# Patient Record
Sex: Female | Born: 1990 | Race: Asian | Hispanic: No | Marital: Married | Smoking: Never smoker
Health system: Southern US, Community
[De-identification: ages and names within clinical notes are randomized; demographics above are authoritative.]

## PROBLEM LIST (undated history)

## (undated) HISTORY — PX: LEG SURGERY: SHX1003

---

## 2009-09-11 ENCOUNTER — Encounter: Payer: Self-pay | Admitting: Emergency Medicine

## 2009-09-11 ENCOUNTER — Ambulatory Visit: Payer: Self-pay | Admitting: Diagnostic Radiology

## 2009-09-12 ENCOUNTER — Encounter (INDEPENDENT_AMBULATORY_CARE_PROVIDER_SITE_OTHER): Payer: Self-pay | Admitting: Internal Medicine

## 2009-09-12 ENCOUNTER — Ambulatory Visit: Payer: Self-pay | Admitting: Vascular Surgery

## 2009-09-12 ENCOUNTER — Inpatient Hospital Stay (HOSPITAL_COMMUNITY): Admission: EM | Admit: 2009-09-12 | Discharge: 2009-09-20 | Payer: Self-pay | Admitting: Internal Medicine

## 2009-09-14 ENCOUNTER — Ambulatory Visit: Payer: Self-pay | Admitting: Infectious Diseases

## 2010-08-01 LAB — CULTURE, BLOOD (ROUTINE X 2): Culture: NO GROWTH

## 2010-08-01 LAB — CK: Total CK: 54 U/L (ref 7–177)

## 2010-08-01 LAB — URINE CULTURE: Colony Count: >=100000

## 2010-08-01 LAB — COMPREHENSIVE METABOLIC PANEL
ALT: 13 U/L (ref 0–35)
Alkaline Phosphatase: 57 U/L (ref 39–117)
BUN: 14 mg/dL (ref 6–23)
BUN: 28 mg/dL — ABNORMAL HIGH (ref 6–23)
Calcium: 7.5 mg/dL — ABNORMAL LOW (ref 8.4–10.5)
Chloride: 108 mEq/L (ref 96–112)
Creatinine, Ser: 0.69 mg/dL (ref 0.4–1.2)
Creatinine, Ser: 1.3 mg/dL — ABNORMAL HIGH (ref 0.4–1.2)
GFR calc Af Amer: >60 mL/min (ref >60)
GFR calc non Af Amer: 53 mL/min — ABNORMAL LOW (ref >60)
Glucose, Bld: 119 mg/dL — ABNORMAL HIGH (ref 70–99)
Glucose, Bld: 138 mg/dL — ABNORMAL HIGH (ref 70–99)
Potassium: 3.3 mEq/L — ABNORMAL LOW (ref 3.5–5.1)
Sodium: 136 mEq/L (ref 135–145)
Sodium: 138 mEq/L (ref 135–145)
Total Bilirubin: 0.9 mg/dL (ref 0.3–1.2)
Total Bilirubin: 1.2 mg/dL (ref 0.3–1.2)
Total Protein: 5.6 g/dL — ABNORMAL LOW (ref 6.0–8.3)
Total Protein: 7.5 g/dL (ref 6.0–8.3)

## 2010-08-01 LAB — DIFFERENTIAL
Basophils Absolute: 0 10*3/uL (ref 0.0–0.1)
Basophils Absolute: 0 10*3/uL (ref 0.0–0.1)
Basophils Absolute: 0.1 10*3/uL (ref 0.0–0.1)
Basophils Relative: 0 % (ref 0–1)
Basophils Relative: 0 % (ref 0–1)
Eosinophils Absolute: 0 10*3/uL (ref 0.0–0.7)
Eosinophils Absolute: 0.2 10*3/uL (ref 0.0–0.7)
Eosinophils Absolute: 0.3 10*3/uL (ref 0.0–0.7)
Eosinophils Relative: 0 % (ref 0–5)
Eosinophils Relative: 2 % (ref 0–5)
Lymphs Abs: 0.3 10*3/uL — ABNORMAL LOW (ref 0.7–4.0)
Lymphs Abs: 0.5 10*3/uL — ABNORMAL LOW (ref 0.7–4.0)
Lymphs Abs: 1 10*3/uL (ref 0.7–4.0)
Lymphs Abs: 1.4 10*3/uL (ref 0.7–4.0)
Monocytes Absolute: 0.4 10*3/uL (ref 0.1–1.0)
Monocytes Absolute: 0.6 10*3/uL (ref 0.1–1.0)
Monocytes Absolute: 0.8 10*3/uL (ref 0.1–1.0)
Monocytes Relative: 14 % — ABNORMAL HIGH (ref 3–12)
Monocytes Relative: 3 % (ref 3–12)
Neutro Abs: 12.5 10*3/uL — ABNORMAL HIGH (ref 1.7–7.7)
Neutro Abs: 13.7 10*3/uL — ABNORMAL HIGH (ref 1.7–7.7)
Neutrophils Relative %: 58 % (ref 43–77)
Neutrophils Relative %: 85 % — ABNORMAL HIGH (ref 43–77)
WBC Morphology: INCREASED

## 2010-08-01 LAB — URINE MICROSCOPIC-ADD ON

## 2010-08-01 LAB — CBC
HCT: 28.7 % — ABNORMAL LOW (ref 36.0–46.0)
HCT: 29.8 % — ABNORMAL LOW (ref 36.0–46.0)
HCT: 30.5 % — ABNORMAL LOW (ref 36.0–46.0)
Hemoglobin: 10.5 g/dL — ABNORMAL LOW (ref 12.0–15.0)
Hemoglobin: 12.1 g/dL (ref 12.0–15.0)
Hemoglobin: 8.8 g/dL — ABNORMAL LOW (ref 12.0–15.0)
Hemoglobin: 9.3 g/dL — ABNORMAL LOW (ref 12.0–15.0)
Hemoglobin: 9.7 g/dL — ABNORMAL LOW (ref 12.0–15.0)
MCHC: 34.2 g/dL (ref 30.0–36.0)
MCHC: 34.4 g/dL (ref 30.0–36.0)
MCHC: 34.5 g/dL (ref 30.0–36.0)
MCV: 85.8 fL (ref 78.0–100.0)
MCV: 86 fL (ref 78.0–100.0)
MCV: 86.1 fL (ref 78.0–100.0)
MCV: 86.8 fL (ref 78.0–100.0)
Platelets: 119 10*3/uL — ABNORMAL LOW (ref 150–400)
Platelets: 161 10*3/uL (ref 150–400)
RBC: 3.01 MIL/uL — ABNORMAL LOW (ref 3.87–5.11)
RBC: 3.15 MIL/uL — ABNORMAL LOW (ref 3.87–5.11)
RBC: 3.56 MIL/uL — ABNORMAL LOW (ref 3.87–5.11)
RBC: 4.16 MIL/uL (ref 3.87–5.11)
RDW: 12.2 % (ref 11.5–15.5)
RDW: 12.4 % (ref 11.5–15.5)
RDW: 12.5 % (ref 11.5–15.5)
RDW: 12.6 % (ref 11.5–15.5)
WBC: 13 10*3/uL — ABNORMAL HIGH (ref 4.0–10.5)
WBC: 14.4 10*3/uL — ABNORMAL HIGH (ref 4.0–10.5)
WBC: 6.1 10*3/uL (ref 4.0–10.5)
WBC: 9.8 10*3/uL (ref 4.0–10.5)

## 2010-08-01 LAB — BASIC METABOLIC PANEL
BUN: 2 mg/dL — ABNORMAL LOW (ref 6–23)
BUN: 5 mg/dL — ABNORMAL LOW (ref 6–23)
BUN: 7 mg/dL (ref 6–23)
CO2: 24 mEq/L (ref 19–32)
CO2: 25 mEq/L (ref 19–32)
CO2: 26 mEq/L (ref 19–32)
Calcium: 7.3 mg/dL — ABNORMAL LOW (ref 8.4–10.5)
Calcium: 7.9 mg/dL — ABNORMAL LOW (ref 8.4–10.5)
Chloride: 106 mEq/L (ref 96–112)
Chloride: 108 mEq/L (ref 96–112)
Creatinine, Ser: 0.5 mg/dL (ref 0.4–1.2)
Creatinine, Ser: 0.58 mg/dL (ref 0.4–1.2)
GFR calc Af Amer: >60 mL/min (ref >60)
GFR calc non Af Amer: >60 mL/min (ref >60)
GFR calc non Af Amer: >60 mL/min (ref >60)
Glucose, Bld: 100 mg/dL — ABNORMAL HIGH (ref 70–99)
Glucose, Bld: 104 mg/dL — ABNORMAL HIGH (ref 70–99)
Glucose, Bld: 98 mg/dL (ref 70–99)
Potassium: 3.5 mEq/L (ref 3.5–5.1)
Potassium: 3.9 mEq/L (ref 3.5–5.1)
Sodium: 133 mEq/L — ABNORMAL LOW (ref 135–145)
Sodium: 135 mEq/L (ref 135–145)
Sodium: 139 mEq/L (ref 135–145)

## 2010-08-01 LAB — GRAM STAIN

## 2010-08-01 LAB — WOUND CULTURE: Culture: NO GROWTH

## 2010-08-01 LAB — URINALYSIS, ROUTINE W REFLEX MICROSCOPIC
Glucose, UA: NEGATIVE mg/dL
Protein, ur: 30 mg/dL — AB
Specific Gravity, Urine: 1.022 (ref 1.005–1.030)

## 2010-08-01 LAB — ANAEROBIC CULTURE

## 2010-08-01 LAB — HIV ANTIBODY (ROUTINE TESTING W REFLEX): HIV: NONREACTIVE

## 2010-08-01 LAB — MRSA CULTURE

## 2010-08-01 LAB — CSF CELL COUNT WITH DIFFERENTIAL
Tube #: 1
WBC, CSF: 1 /mm3 (ref 0–5)

## 2010-08-01 LAB — LACTIC ACID, PLASMA
Lactic Acid, Venous: 0.9 mmol/L (ref 0.5–2.2)
Lactic Acid, Venous: 2.4 mmol/L — ABNORMAL HIGH (ref 0.5–2.2)
Lactic Acid, Venous: 3 mmol/L — ABNORMAL HIGH (ref 0.5–2.2)

## 2010-08-01 LAB — CSF CULTURE W GRAM STAIN

## 2010-08-01 LAB — GLUCOSE, CSF: Glucose, CSF: 72 mg/dL (ref 43–76)

## 2010-08-01 LAB — SYNOVIAL CELL COUNT + DIFF, W/ CRYSTALS
Monocyte-Macrophage-Synovial Fluid: 11 % — ABNORMAL LOW (ref 50–90)
WBC, Synovial: 23965 /mm3 — ABNORMAL HIGH (ref 0–200)

## 2010-08-01 LAB — BODY FLUID CULTURE

## 2010-08-01 LAB — PROTIME-INR: INR: 1.67 — ABNORMAL HIGH (ref 0.00–1.49)

## 2010-08-01 LAB — DIC (DISSEMINATED INTRAVASCULAR COAGULATION)PANEL: Platelets: 115 10*3/uL — ABNORMAL LOW (ref 150–400)

## 2011-05-04 IMAGING — CT CT ANGIO CHEST
2 of 6 series · 19 of 36 positions shown · IV contrast (APPLIED)
Comparison: Chest x-ray 09/13/2009.

CLINICAL DATA: Chest pain and fever.  Rule out pulmonary embolism.

CT ANGIOGRAPHY CHEST WITH CONTRAST
TECHNIQUE: Multidetector CT imaging of the chest was performed
using the standard protocol during bolus administration of
intravenous contrast.  Multiplanar CT image reconstructions
including MIPs were obtained to evaluate the vascular anatomy.
Contrast:  100 ml Pmnipaque-I99 IV.

[Series 8: pulm embolism 1.0 b25f thins · axial · 0.58mm/px · z∈[-206,-2]mm · 18 of 228 slices shown]
[im 12/228  lung]
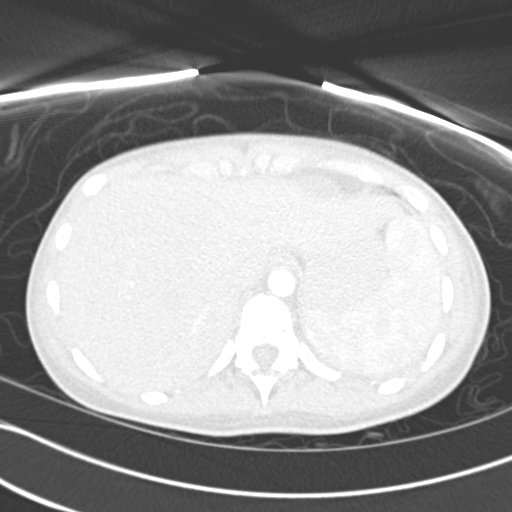
[im 23/228  mediastinal]
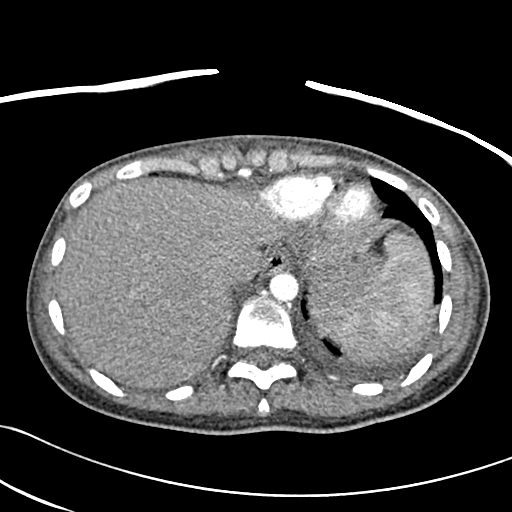
[im 35/228  lung]
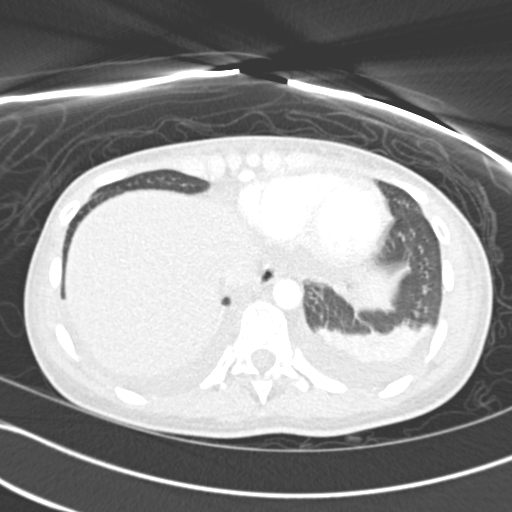
[im 46/228  mediastinal]
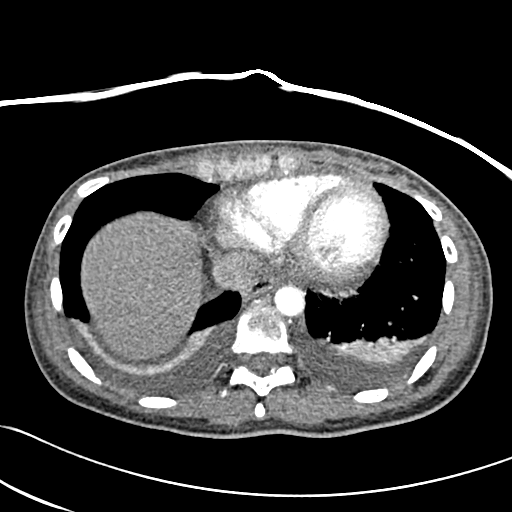
[im 57/228  lung]
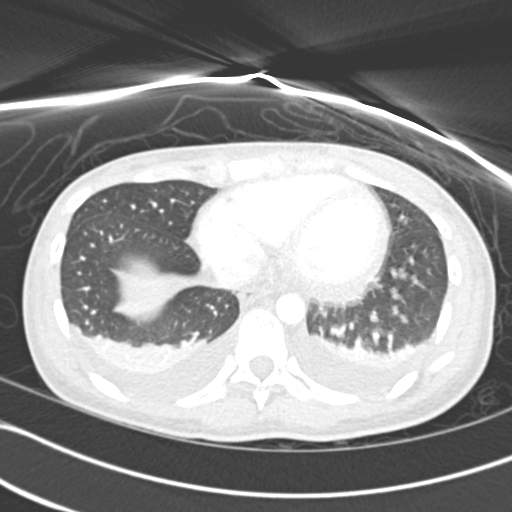
[im 69/228  mediastinal]
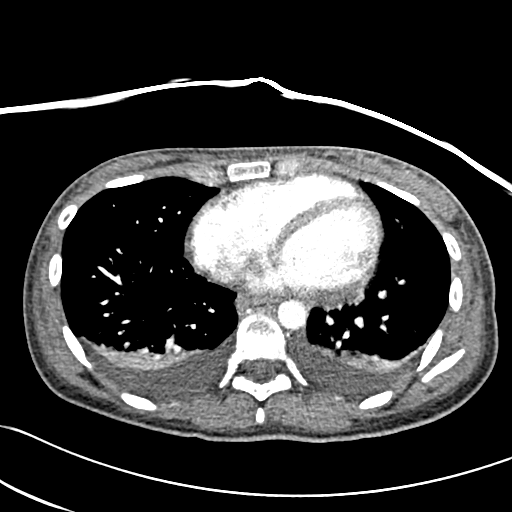
[im 80/228  lung]
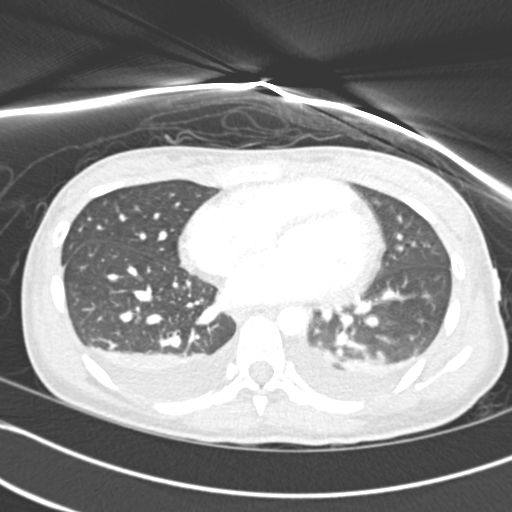
[im 91/228  mediastinal]
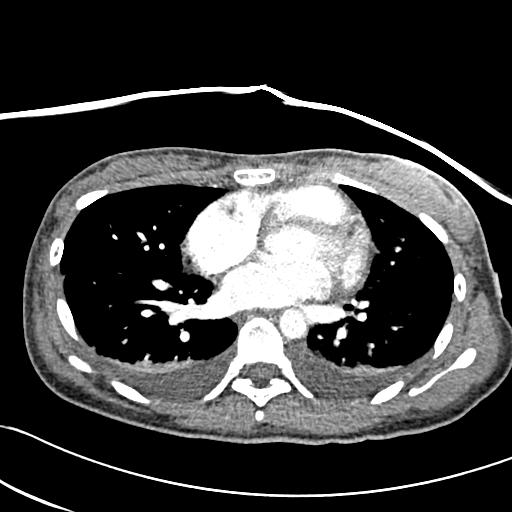
[im 103/228  lung]
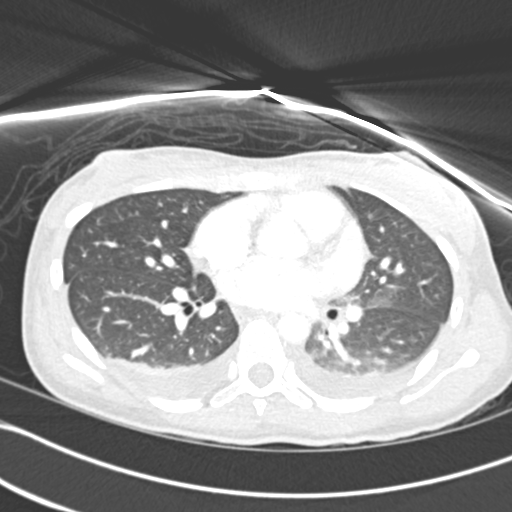
[im 125/228  mediastinal]
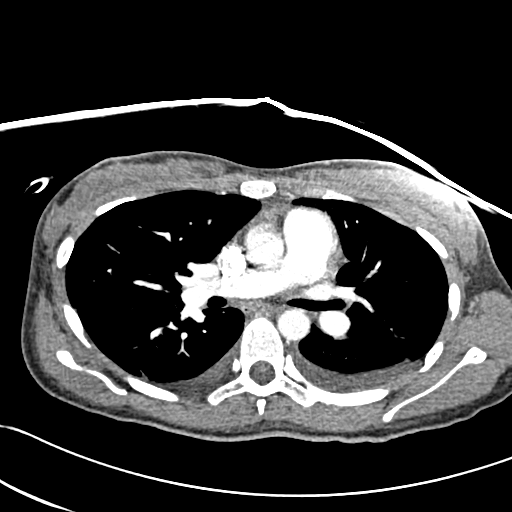
[im 137/228  lung]
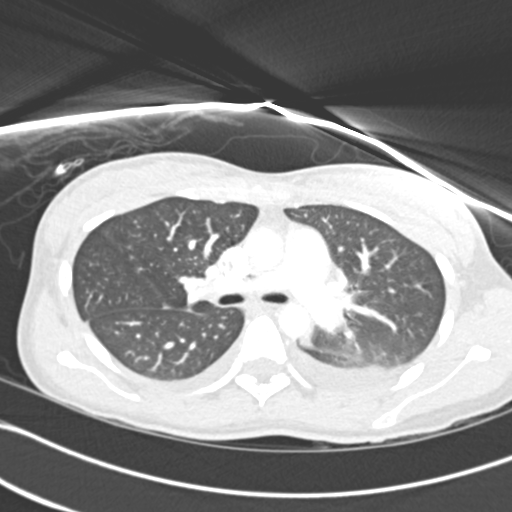
[im 148/228  mediastinal]
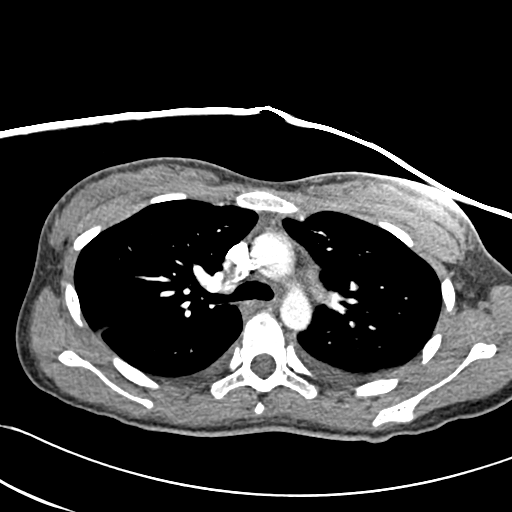
[im 159/228  lung]
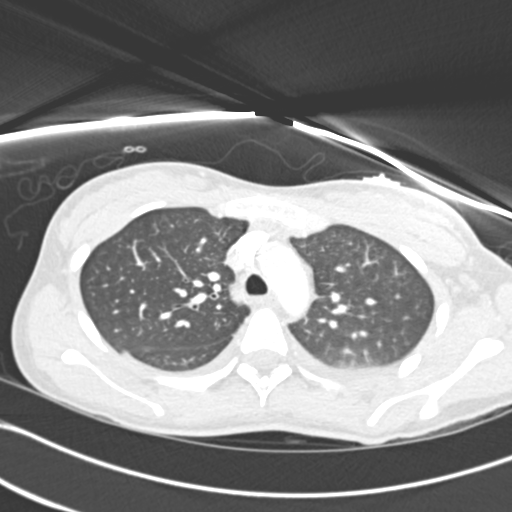
[im 171/228  mediastinal]
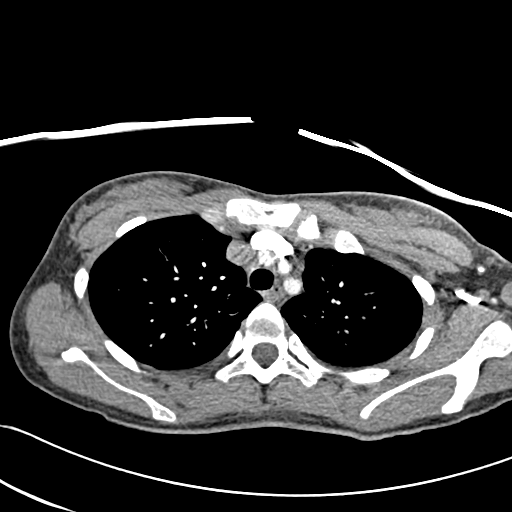
[im 182/228  lung]
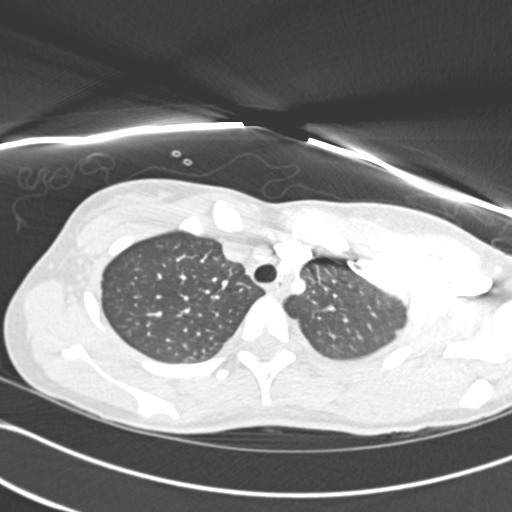
[im 193/228  mediastinal]
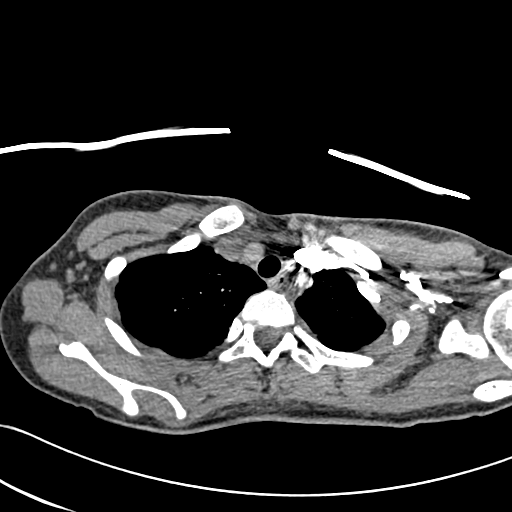
[im 205/228  lung]
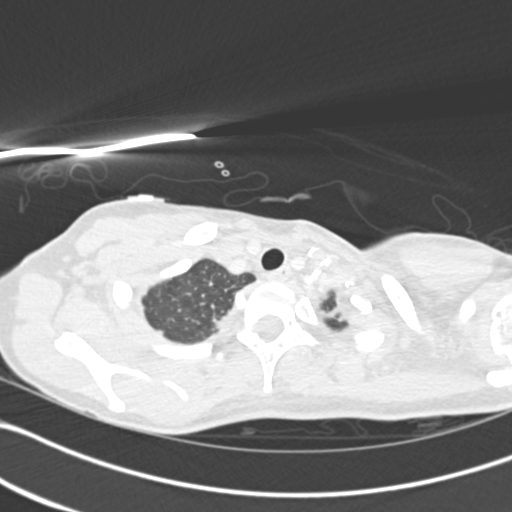
[im 216/228  mediastinal]
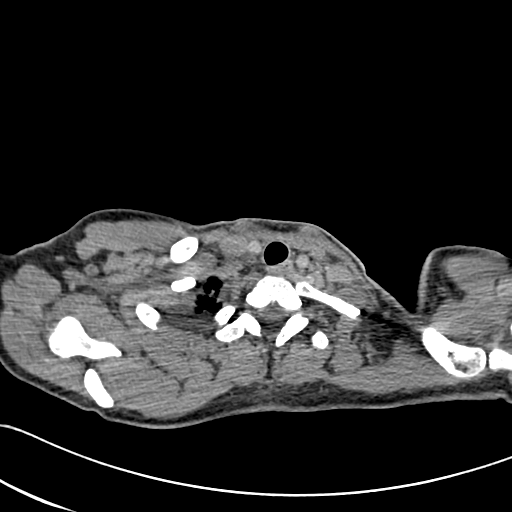

[Series 604: cor · coronal · 0.58mm/px · 1 of 68 slices shown]
[im 34/68  mediastinal]
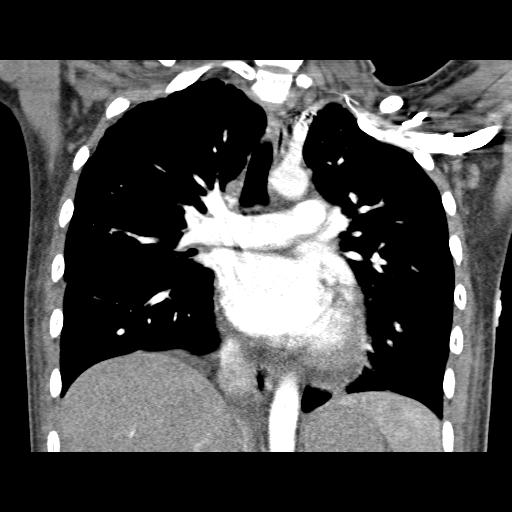

[19 of 36 positions shown; findings below may reference images not displayed]

FINDINGS: Negative for pulmonary embolism.  The aorta is normal.
The heart size is normal and there is no pericardial effusion.

Small bilateral pleural effusions are present.  There is bibasilar
atelectasis.  Left perihilar infiltrate noted on the chest x-ray
appears to be atelectasis, no definite pneumonia.  There is no mass
or adenopathy.

Review of the MIP images confirms the above findings.
IMPRESSION: Negative for pulmonary embolism.

Bilateral pleural effusions and bibasilar atelectasis.

## 2017-09-03 DIAGNOSIS — R109 Unspecified abdominal pain: Secondary | ICD-10-CM | POA: Diagnosis not present

## 2017-11-27 ENCOUNTER — Encounter (HOSPITAL_COMMUNITY): Payer: Self-pay | Admitting: Emergency Medicine

## 2017-11-27 ENCOUNTER — Emergency Department (HOSPITAL_COMMUNITY)
Admission: EM | Admit: 2017-11-27 | Discharge: 2017-11-27 | Disposition: A | Discharge status: Home / Self Care | Payer: Self-pay | Attending: Emergency Medicine | Admitting: Emergency Medicine

## 2017-11-27 DIAGNOSIS — G43009 Migraine without aura, not intractable, without status migrainosus: Secondary | ICD-10-CM | POA: Insufficient documentation

## 2017-11-27 DIAGNOSIS — Z3202 Encounter for pregnancy test, result negative: Secondary | ICD-10-CM | POA: Insufficient documentation

## 2017-11-27 DIAGNOSIS — R519 Headache, unspecified: Secondary | ICD-10-CM

## 2017-11-27 DIAGNOSIS — R51 Headache: Secondary | ICD-10-CM

## 2017-11-27 LAB — CBC WITH DIFFERENTIAL/PLATELET
Basophils Absolute: 0 10*3/uL (ref 0.0–0.1)
Basophils Relative: 0 %
Eosinophils Absolute: 0.5 10*3/uL (ref 0.0–0.7)
Eosinophils Relative: 9 %
HCT: 39.1 % (ref 36.0–46.0)
Hemoglobin: 12.6 g/dL (ref 12.0–15.0)
Lymphocytes Relative: 13 %
Lymphs Abs: 0.7 10*3/uL (ref 0.7–4.0)
MCH: 28.3 pg (ref 26.0–34.0)
MCHC: 32.2 g/dL (ref 30.0–36.0)
MCV: 87.9 fL (ref 78.0–100.0)
Monocytes Absolute: 0.7 10*3/uL (ref 0.1–1.0)
Monocytes Relative: 13 %
Neutro Abs: 3.2 10*3/uL (ref 1.7–7.7)
Neutrophils Relative %: 65 %
Platelets: 218 10*3/uL (ref 150–400)
RBC: 4.45 MIL/uL (ref 3.87–5.11)
RDW: 12.2 % (ref 11.5–15.5)
WBC: 5 10*3/uL (ref 4.0–10.5)

## 2017-11-27 LAB — BASIC METABOLIC PANEL
Anion gap: 7 (ref 5–15)
BUN: 9 mg/dL (ref 6–20)
CO2: 29 mmol/L (ref 22–32)
Calcium: 8.9 mg/dL (ref 8.9–10.3)
Chloride: 105 mmol/L (ref 98–111)
Creatinine, Ser: 0.63 mg/dL (ref 0.44–1.00)
GFR calc Af Amer: >60 mL/min (ref >60)
GFR calc non Af Amer: >60 mL/min (ref >60)
Glucose, Bld: 87 mg/dL (ref 70–99)
Potassium: 3.6 mmol/L (ref 3.5–5.1)
Sodium: 141 mmol/L (ref 135–145)

## 2017-11-27 LAB — URINALYSIS, ROUTINE W REFLEX MICROSCOPIC
Bilirubin Urine: NEGATIVE
Glucose, UA: NEGATIVE mg/dL
Hgb urine dipstick: NEGATIVE
Ketones, ur: NEGATIVE mg/dL
Leukocytes, UA: NEGATIVE
Nitrite: NEGATIVE
Protein, ur: NEGATIVE mg/dL
Specific Gravity, Urine: 1.011 (ref 1.005–1.030)
pH: 6 (ref 5.0–8.0)

## 2017-11-27 LAB — PREGNANCY, URINE: Preg Test, Ur: NEGATIVE

## 2017-11-27 MED ORDER — KETOROLAC TROMETHAMINE 30 MG/ML IJ SOLN
30.0000 mg | Freq: Once | INTRAMUSCULAR | Status: AC
  Administered 2017-11-27: 30 mg via INTRAVENOUS
  Filled 2017-11-27: qty 1

## 2017-11-27 MED ORDER — PROCHLORPERAZINE EDISYLATE 10 MG/2ML IJ SOLN
10.0000 mg | Freq: Once | INTRAMUSCULAR | Status: AC
  Administered 2017-11-27: 10 mg via INTRAVENOUS
  Filled 2017-11-27: qty 2

## 2017-11-27 MED ORDER — SODIUM CHLORIDE 0.9 % IV BOLUS
1000.0000 mL | Freq: Once | INTRAVENOUS | Status: AC
  Administered 2017-11-27: 1000 mL via INTRAVENOUS

## 2017-11-27 MED ORDER — DIPHENHYDRAMINE HCL 50 MG/ML IJ SOLN
25.0000 mg | Freq: Once | INTRAMUSCULAR | Status: AC
  Administered 2017-11-27: 25 mg via INTRAVENOUS
  Filled 2017-11-27: qty 1

## 2017-11-27 NOTE — Discharge Instructions (Addendum)
Return here as needed.  Follow-up with your primary doctor.  You can continue to use the Fioricet as needed for the headache.  Increase your fluid intake.

## 2017-11-27 NOTE — ED Triage Notes (Signed)
Patient here from home with complaints of headache. Given meds with no relief. Tylenol this morning with no relief.

## 2017-11-27 NOTE — ED Provider Notes (Signed)
[facility]-EMERGENCY DEPT Provider Note   CSN: [id number] Arrival date & time: 11/27/17  1005     History   Chief Complaint Chief Complaint  Patient presents with  . Headache    HPI Judith Cook is a 27 y.o. female.  HPI Patient presents to the emergency department with headache over the last 3 days.  The patient states that she was having some chills and some body aches when it initially started.  The patient states she did take some Tylenol without significant relief of her symptoms.  Patient was seen at an urgent care yesterday and provided with a Fioricet prescription.  And Flexeril.  She states that they did not seem to help with her headache.  Patient was given no other prescriptions.  Patient states nothing seems to make the condition better but she does have nausea with some light sensitivity.  Patient states she had similar headaches in the past.  The patient denies chest pain, shortness of breath,blurred vision, neck pain, fever, cough, weakness, numbness, dizziness, anorexia, edema, abdominal pain,  vomiting, diarrhea, rash, back pain, dysuria, hematemesis, bloody stool, near syncope, or syncope. History reviewed. No pertinent past medical history.  There are no active problems to display for this patient.   History reviewed. No pertinent surgical history.   OB History   None      Home Medications    Prior to Admission medications   Medication Sig Start Date End Date Taking? Authorizing Provider  ACETAMINOPHEN-BUTALBITAL 50-325 MG TABS Take 1-2 tablets by mouth every 6 (six) hours as needed (headache).   Yes [provider]  BIOTIN PO Take 1 capsule by mouth daily.   Yes [provider]  cyclobenzaprine (FLEXERIL) 5 MG tablet Take 5-10 mg by mouth 3 (three) times daily as needed for muscle spasms.   Yes [provider]  nitrofurantoin, macrocrystal-monohydrate, (MACROBID) 100 MG capsule Take 100 mg by mouth 2  (two) times daily. For 5 days 11/20/17  Yes [provider]    Family History No family history on file.  Social History Social History   Tobacco Use  . Smoking status: Never Smoker  . Smokeless tobacco: Never Used  Substance Use Topics  . Alcohol use: Never    Frequency: Never  . Drug use: Never     Allergies   Patient has no known allergies.   Review of Systems Review of Systems All other systems negative except as documented in the HPI. All pertinent positives and negatives as reviewed in the HPI.  Physical Exam Updated Vital Signs BP 96/70   Pulse 80   Temp 98.5 F (36.9 C) (Oral)   Resp 20   SpO2 99%   Physical Exam  Constitutional: She is oriented to person, place, and time. She appears well-developed and well-nourished. No distress.  HENT:  Head: Normocephalic and atraumatic.  Mouth/Throat: Oropharynx is clear and moist.  Eyes: Pupils are equal, round, and reactive to light.  Neck: Normal range of motion. Neck supple.  Cardiovascular: Normal rate, regular rhythm and normal heart sounds. Exam reveals no gallop and no friction rub.  No murmur heard. Pulmonary/Chest: Effort normal and breath sounds normal. No respiratory distress. She has no wheezes.  Abdominal: Soft. Bowel sounds are normal. She exhibits no distension. There is no tenderness.  Neurological: She is alert and oriented to person, place, and time. She has normal strength. She exhibits normal muscle tone. Coordination and gait normal. GCS eye subscore is 4. GCS verbal subscore  is 5. GCS motor subscore is 6.  Skin: Skin is warm and dry. Capillary refill takes less than 2 seconds. No rash noted. No erythema.  Psychiatric: She has a normal mood and affect. Her behavior is normal.  Nursing note and vitals reviewed.    ED Treatments / Results  Labs (all labs ordered are listed, but only abnormal results are displayed) Labs Reviewed  BASIC METABOLIC PANEL  CBC WITH DIFFERENTIAL/PLATELET    URINALYSIS, ROUTINE W REFLEX MICROSCOPIC  PREGNANCY, URINE    EKG None  Radiology No results found.  Procedures Procedures (including critical care time)  Medications Ordered in ED Medications  sodium chloride 0.9 % bolus 1,000 mL (1,000 mLs Intravenous New Bag/Given 11/27/17 1421)  sodium chloride 0.9 % bolus 1,000 mL (0 mLs Intravenous Stopped 11/27/17 1421)  ketorolac (TORADOL) 30 MG/ML injection 30 mg (30 mg Intravenous Given 11/27/17 1236)  diphenhydrAMINE (BENADRYL) injection 25 mg (25 mg Intravenous Given 11/27/17 1236)  prochlorperazine (COMPAZINE) injection 10 mg (10 mg Intravenous Given 11/27/17 1236)     Initial Impression / Assessment and Plan / ED Course  I have reviewed the triage vital signs and the nursing notes.  Pertinent labs & imaging results that were available during my care of the patient were reviewed by me and considered in my medical decision making (see chart for details).  The patient has what I feel like is a migraine type headache.  The patient is feeling better following IV fluids and medications.  Patient's neurological exam is intact.  She does not have any signs of sepsis or overt infection at this time.  Patient is advised to return here for any worsening in her condition.  Patient agrees with plan and all questions were answered.  Final Clinical Impressions(s) / ED Diagnoses   Final diagnoses:  None    ED Discharge Orders    None       Charlestine Night, PA-C 11/27/17 1457    Gerhard Munch, MD 11/27/17 1625

## 2019-01-15 ENCOUNTER — Other Ambulatory Visit: Payer: Self-pay

## 2019-01-15 DIAGNOSIS — Z20822 Contact with and (suspected) exposure to covid-19: Secondary | ICD-10-CM

## 2019-01-16 LAB — NOVEL CORONAVIRUS, NAA: SARS-CoV-2, NAA: NOT DETECTED

## 2019-01-30 ENCOUNTER — Other Ambulatory Visit: Payer: Self-pay

## 2019-01-30 DIAGNOSIS — Z20822 Contact with and (suspected) exposure to covid-19: Secondary | ICD-10-CM

## 2019-02-01 LAB — NOVEL CORONAVIRUS, NAA: SARS-CoV-2, NAA: NOT DETECTED

## 2019-04-28 DIAGNOSIS — Z20828 Contact with and (suspected) exposure to other viral communicable diseases: Secondary | ICD-10-CM | POA: Diagnosis not present

## 2019-04-28 DIAGNOSIS — J029 Acute pharyngitis, unspecified: Secondary | ICD-10-CM | POA: Diagnosis not present

## 2019-05-06 DIAGNOSIS — Z03818 Encounter for observation for suspected exposure to other biological agents ruled out: Secondary | ICD-10-CM | POA: Diagnosis not present

## 2020-02-22 DIAGNOSIS — Z20822 Contact with and (suspected) exposure to covid-19: Secondary | ICD-10-CM | POA: Diagnosis not present

## 2020-02-23 DIAGNOSIS — Z03818 Encounter for observation for suspected exposure to other biological agents ruled out: Secondary | ICD-10-CM | POA: Diagnosis not present

## 2023-05-15 NOTE — L&D Delivery Note (Signed)
 Delivery Note MARCHE HOTTENSTEIN is a 33 y.o. G1 now P1 who had a spontaneous delivery at [redacted]w[redacted]d on 05/11/24 at 23:38.  viable female was delivered   (Presentation: LOA ).  APGAR: 8, 8; weight - 2980g (6# 9 oz)  Induction of labor for post-dates. Induced with cytotec , pitocin  and AROM. Progressed normally . Received epidural for pain management. Pushed for 47 minutes. Baby was delivered without difficulty . Nuchal cord x 1 reduced prior to delivery.  Baby had good tone and cry and was placed on the maternal abdomen where routine stimulation and bulb suction was performed. Delayed cord clamping for 60 seconds. I clamped the cord and FOB cut it.  Delivery of placenta was spontaneous. Cord blood was collected. Placenta was found to be intact, 3 -vessel cord was noted. The fundus was found to be firm. 2nd degree perineal laceration was repaired in the normal sterile fashion with 2-0 vicryl. Right sulcal repaired in running locking fashion. Estimated blood loss 400 cc. Cord gases were not sent. Instrument and gauze counts were correct at the end of the procedure. Fundus was firm, bleeding was minimal,  and both mother and baby were doing well when I left the room.   Placenta status: to L&D Cord:  segment and blood collected.  Anesthesia:  epidural  Episiotomy:  none Lacerations:  2nd degree, right sulcal, left peri-clitoral - hemostatic without repair  Suture Repair: 2.0 vicryl Est. Blood Loss (mL):  400 cc  Mom to postpartum.  Baby to Couplet care / Skin to Skin.  Charmaine HERO Sequita Wise 05/12/2024, 12:22 AM

## 2023-10-03 LAB — OB RESULTS CONSOLE GC/CHLAMYDIA
Chlamydia: NEGATIVE
Neisseria Gonorrhea: NEGATIVE

## 2023-10-08 LAB — OB RESULTS CONSOLE RUBELLA ANTIBODY, IGM: Rubella: IMMUNE

## 2023-10-08 LAB — OB RESULTS CONSOLE HEPATITIS B SURFACE ANTIGEN: Hepatitis B Surface Ag: NEGATIVE

## 2023-10-08 LAB — OB RESULTS CONSOLE RPR: RPR: NONREACTIVE

## 2023-10-08 LAB — HEPATITIS C ANTIBODY: HCV Ab: NEGATIVE

## 2023-10-08 LAB — OB RESULTS CONSOLE HIV ANTIBODY (ROUTINE TESTING): HIV: NONREACTIVE

## 2023-10-08 LAB — OB RESULTS CONSOLE ANTIBODY SCREEN: Antibody Screen: NEGATIVE

## 2024-04-08 LAB — OB RESULTS CONSOLE GBS: GBS: NEGATIVE

## 2024-04-27 ENCOUNTER — Other Ambulatory Visit: Payer: Self-pay | Admitting: Obstetrics and Gynecology

## 2024-04-27 DIAGNOSIS — O480 Post-term pregnancy: Secondary | ICD-10-CM

## 2024-04-28 ENCOUNTER — Telehealth (HOSPITAL_COMMUNITY): Payer: Self-pay | Admitting: *Deleted

## 2024-04-28 ENCOUNTER — Encounter (HOSPITAL_COMMUNITY): Payer: Self-pay | Admitting: *Deleted

## 2024-04-28 NOTE — Telephone Encounter (Signed)
 Preadmission screen

## 2024-04-29 ENCOUNTER — Telehealth (HOSPITAL_COMMUNITY): Payer: Self-pay | Admitting: *Deleted

## 2024-04-29 ENCOUNTER — Encounter (HOSPITAL_COMMUNITY): Payer: Self-pay | Admitting: *Deleted

## 2024-04-29 NOTE — Telephone Encounter (Signed)
 Preadmission screen

## 2024-05-11 ENCOUNTER — Inpatient Hospital Stay (HOSPITAL_COMMUNITY)

## 2024-05-11 ENCOUNTER — Inpatient Hospital Stay (HOSPITAL_COMMUNITY): Admitting: Anesthesiology

## 2024-05-11 ENCOUNTER — Other Ambulatory Visit: Payer: Self-pay

## 2024-05-11 ENCOUNTER — Inpatient Hospital Stay (HOSPITAL_COMMUNITY)
Admission: RE | Admit: 2024-05-11 | Discharge: 2024-05-13 | DRG: 807 | Disposition: A | Discharge status: Home / Self Care

## 2024-05-11 ENCOUNTER — Encounter (HOSPITAL_COMMUNITY): Payer: Self-pay | Admitting: Obstetrics and Gynecology

## 2024-05-11 DIAGNOSIS — O365930 Maternal care for other known or suspected poor fetal growth, third trimester, not applicable or unspecified: Secondary | ICD-10-CM | POA: Diagnosis present

## 2024-05-11 DIAGNOSIS — Z3A41 41 weeks gestation of pregnancy: Secondary | ICD-10-CM | POA: Diagnosis not present

## 2024-05-11 DIAGNOSIS — O480 Post-term pregnancy: Principal | ICD-10-CM | POA: Diagnosis present

## 2024-05-11 DIAGNOSIS — Z8249 Family history of ischemic heart disease and other diseases of the circulatory system: Secondary | ICD-10-CM

## 2024-05-11 DIAGNOSIS — O9902 Anemia complicating childbirth: Secondary | ICD-10-CM | POA: Diagnosis present

## 2024-05-11 LAB — SYPHILIS: RPR W/REFLEX TO RPR TITER AND TREPONEMAL ANTIBODIES, TRADITIONAL SCREENING AND DIAGNOSIS ALGORITHM: RPR Ser Ql: NONREACTIVE

## 2024-05-11 LAB — CBC
HCT: 32.3 % — ABNORMAL LOW (ref 36.0–46.0)
Hemoglobin: 10.8 g/dL — ABNORMAL LOW (ref 12.0–15.0)
MCH: 28.5 pg (ref 26.0–34.0)
MCHC: 33.4 g/dL (ref 30.0–36.0)
MCV: 85.2 fL (ref 80.0–100.0)
Platelets: 239 K/uL (ref 150–400)
RBC: 3.79 MIL/uL — ABNORMAL LOW (ref 3.87–5.11)
RDW: 13.8 % (ref 11.5–15.5)
WBC: 6.7 K/uL (ref 4.0–10.5)
nRBC: 0 % (ref 0.0–0.2)

## 2024-05-11 LAB — TYPE AND SCREEN
ABO/RH(D): B POS
Antibody Screen: NEGATIVE

## 2024-05-11 MED ORDER — OXYTOCIN-SODIUM CHLORIDE 30-0.9 UT/500ML-% IV SOLN: 2.5000 [IU]/h | INTRAVENOUS | Status: DC

## 2024-05-11 MED ORDER — DIPHENHYDRAMINE HCL 50 MG/ML IJ SOLN: 12.5000 mg | INTRAMUSCULAR | Status: DC | PRN

## 2024-05-11 MED ORDER — TERBUTALINE SULFATE 1 MG/ML IJ SOLN: 0.2500 mg | Freq: Once | INTRAMUSCULAR | Status: DC | PRN

## 2024-05-11 MED ORDER — OXYCODONE-ACETAMINOPHEN 5-325 MG PO TABS: 1.0000 | ORAL_TABLET | ORAL | Status: DC | PRN

## 2024-05-11 MED ORDER — LACTATED RINGERS IV SOLN: 500.0000 mL | Freq: Once | INTRAVENOUS | Status: DC

## 2024-05-11 MED ORDER — LIDOCAINE HCL (PF) 1 % IJ SOLN
INTRAMUSCULAR | Status: DC | PRN
  Administered 2024-05-11: 8 mL via EPIDURAL

## 2024-05-11 MED ORDER — LACTATED RINGERS IV SOLN
500.0000 mL | Freq: Once | INTRAVENOUS | Status: AC
  Administered 2024-05-11: 500 mL via INTRAVENOUS

## 2024-05-11 MED ORDER — SOD CITRATE-CITRIC ACID 500-334 MG/5ML PO SOLN: 30.0000 mL | ORAL | Status: DC | PRN

## 2024-05-11 MED ORDER — ACETAMINOPHEN 325 MG PO TABS
650.0000 mg | ORAL_TABLET | ORAL | Status: DC | PRN
  Administered 2024-05-11: 650 mg via ORAL
  Filled 2024-05-11: qty 2

## 2024-05-11 MED ORDER — OXYTOCIN BOLUS FROM INFUSION
333.0000 mL | Freq: Once | INTRAVENOUS | Status: AC
  Administered 2024-05-11: 333 mL via INTRAVENOUS

## 2024-05-11 MED ORDER — OXYCODONE-ACETAMINOPHEN 5-325 MG PO TABS: 2.0000 | ORAL_TABLET | ORAL | Status: DC | PRN

## 2024-05-11 MED ORDER — PHENYLEPHRINE 80 MCG/ML (10ML) SYRINGE FOR IV PUSH (FOR BLOOD PRESSURE SUPPORT): 80.0000 ug | PREFILLED_SYRINGE | INTRAVENOUS | Status: DC | PRN

## 2024-05-11 MED ORDER — OXYTOCIN-SODIUM CHLORIDE 30-0.9 UT/500ML-% IV SOLN
1.0000 m[IU]/min | INTRAVENOUS | Status: DC
  Administered 2024-05-11: 2 m[IU]/min via INTRAVENOUS
  Filled 2024-05-11: qty 500

## 2024-05-11 MED ORDER — FENTANYL CITRATE (PF) 100 MCG/2ML IJ SOLN: 50.0000 ug | INTRAMUSCULAR | Status: DC | PRN

## 2024-05-11 MED ORDER — FENTANYL-BUPIVACAINE-NACL 0.5-0.125-0.9 MG/250ML-% EP SOLN: 12.0000 mL/h | EPIDURAL | Status: DC | PRN

## 2024-05-11 MED ORDER — LACTATED RINGERS IV SOLN
500.0000 mL | INTRAVENOUS | Status: DC | PRN
  Administered 2024-05-11 – 2024-05-12 (×2): 500 mL via INTRAVENOUS

## 2024-05-11 MED ORDER — MISOPROSTOL 25 MCG QUARTER TABLET
25.0000 ug | ORAL_TABLET | ORAL | Status: DC | PRN
  Administered 2024-05-11 (×2): 25 ug via VAGINAL
  Filled 2024-05-11 (×2): qty 1

## 2024-05-11 MED ORDER — LACTATED RINGERS IV SOLN: INTRAVENOUS | Status: DC

## 2024-05-11 MED ORDER — LIDOCAINE HCL (PF) 1 % IJ SOLN: 30.0000 mL | INTRAMUSCULAR | Status: DC | PRN

## 2024-05-11 MED ORDER — FENTANYL-BUPIVACAINE-NACL 0.5-0.125-0.9 MG/250ML-% EP SOLN
12.0000 mL/h | EPIDURAL | Status: DC | PRN
  Administered 2024-05-11: 12 mL/h via EPIDURAL
  Filled 2024-05-11: qty 250

## 2024-05-11 MED ORDER — ONDANSETRON HCL 4 MG/2ML IJ SOLN: 4.0000 mg | Freq: Four times a day (QID) | INTRAMUSCULAR | Status: DC | PRN

## 2024-05-11 MED ORDER — EPHEDRINE 5 MG/ML INJ: 10.0000 mg | INTRAVENOUS | Status: DC | PRN

## 2024-05-11 NOTE — Progress Notes (Signed)
 Judith Cook is a 33 y.o. G1P0 at [redacted]w[redacted]d admitted for IOL for post-date  Subjective: Increased pain with contractions, is going to try NO. Denies LOF or VB. + FM  Objective: BP 128/76   Pulse 83   Temp 98.1 F (36.7 C) (Oral)   Resp 18   Ht 5' 5 (1.651 m)   Wt 83.1 kg   SpO2 100%   BMI 30.50 kg/m  No intake/output data recorded. No intake/output data recorded.  FHT: FHR 125, moderate variability, accelerations +, no deceleration. Toco: q2-4 min Cat 1    SVE:   Dilation: 4 Effacement (%): 60 Station: -2 Exam by:: Shahan Starks  Labs: Lab Results  Component Value Date   WBC 6.7 05/11/2024   HGB 10.8 (L) 05/11/2024   HCT 32.3 (L) 05/11/2024   MCV 85.2 05/11/2024   PLT 239 05/11/2024    Assessment / Plan: 33 yo G1P0 at [redacted]w[redacted]d admitted for IOL for post-dates   Labor: s/p cytotec  x 2, pitocin  ordered. Declined AROM w/ this exam, amenable at next. Recheck in 4hr or sooner PRN Fetal Wellbeing:  Category I Pain Control:  IV pain meds until active labor, epidural PRN    Charmaine CHRISTELLA Oz, MD 05/11/2024, 4:39 PM

## 2024-05-11 NOTE — Progress Notes (Signed)
 Judith Cook is a 33 y.o. G1P0 at [redacted]w[redacted]d admitted for IOL for post-date  Subjective: Comfortable w/ epidural. Continued LO. Denies VB. + FM  Objective: BP 112/74   Pulse 70   Temp 97.9 F (36.6 C) (Axillary)   Resp 18   Ht 5' 5 (1.651 m)   Wt 83.1 kg   SpO2 98%   BMI 30.50 kg/m  No intake/output data recorded. Total I/O In: -  Out: 500 [Urine:500]  FHT: FHR 130, moderate variability, accelerations +, intermittent variable deceleration. Toco: q2 min Cat 1    SVE:   Dilation: Lip/rim Effacement (%): 90 Station: 0 Exam by:: Newell Samuel, RN/Erica Karry, RN  Labs: Lab Results  Component Value Date   WBC 6.7 05/11/2024   HGB 10.8 (L) 05/11/2024   HCT 32.3 (L) 05/11/2024   MCV 85.2 05/11/2024   PLT 239 05/11/2024    Assessment / Plan: 33 yo G1P0 at [redacted]w[redacted]d admitted for IOL for post-dates   Labor: s/p cytotec  x 2, pitocin  since 18:40, AROM @ 19:40. Progressing well. Recheck in 30 minutes.  Fetal Wellbeing:  Category I Pain Control:  epidural in place     Charmaine CHRISTELLA Oz, MD 05/11/2024, 10:07 PM

## 2024-05-11 NOTE — Anesthesia Procedure Notes (Signed)
 Epidural Patient location during procedure: OB Start time: 05/11/2024 5:16 PM End time: 05/11/2024 5:20 PM  Staffing Anesthesiologist: Mallory Manus, MD  Preanesthetic Checklist Completed: patient identified, IV checked, site marked, risks and benefits discussed, surgical consent, monitors and equipment checked, pre-op evaluation and timeout performed  Epidural Patient position: sitting Prep: DuraPrep and site prepped and draped Patient monitoring: continuous pulse ox and blood pressure Approach: midline Location: L4-L5 Injection technique: LOR air  Needle:  Needle type: Tuohy  Needle gauge: 17 G Needle length: 9 cm and 9 Needle insertion depth: 6 cm Catheter type: closed end flexible Catheter size: 19 Gauge Catheter at skin depth: 12 cm Test dose: negative  Assessment Events: blood not aspirated, no cerebrospinal fluid, injection not painful, no injection resistance, no paresthesia and negative IV test

## 2024-05-11 NOTE — H&P (Signed)
 Judith Cook is a 33 y.o. female G1P0 at [redacted]w[redacted]d presenting for IOL for post-dates.  Pregnancy complicated by: FGR, now resolved - By [facility] < 10% at 24 weeks (overal EFW 20%), started weekly UAD/BPP at 28 weeks &serial growth US . Declines referral to MFM. RESOLVED at 32 Last growth at 36w - EFW 2607g/5lb12oz (22%), AC 17.2%. AFI 12.4 Anemia of pregnancy - on PO iron, admit Hgb 10.8  Patient is doing well. Notes mild pain with contraction. Denies LOF or VB, + FM   OB History     Gravida  1   Para      Term      Preterm      AB      Living         SAB      IAB      Ectopic      Multiple      Live Births             No past medical history on file. Past Surgical History:  Procedure Laterality Date   LEG SURGERY Right    Family History: family history includes Hypertension in her father. Social History:  reports that she has never smoked. She has never used smokeless tobacco. She reports that she does not drink alcohol and does not use drugs.     Maternal Diabetes: No Genetic Screening: Normal Maternal Ultrasounds/Referrals: IUGR - resolved Fetal Ultrasounds or other Referrals:  None Maternal Substance Abuse:  No Significant Maternal Medications:  None Significant Maternal Lab Results:  Group B Strep negative Number of Prenatal Visits:greater than 3 verified prenatal visits Maternal Vaccinations:TDap   Review of Systems  Constitutional:  Negative for chills and fever.  Respiratory:  Negative for cough and shortness of breath.   Cardiovascular:  Negative for chest pain and palpitations.  Gastrointestinal:  Negative for diarrhea, nausea and vomiting.  Genitourinary:  Negative for dysuria.  Skin:  Negative for rash.  Neurological:  Negative for syncope and headaches.  Psychiatric/Behavioral:  The patient is not nervous/anxious.      Blood pressure 129/82, pulse 94, temperature 98 F (36.7 C), resp. rate 18, height 5' 5 (1.651 m), weight 83.1 kg,  SpO2 99%.  SVE: 1/T/-3, RN @ [id number]  Exam Physical Exam Constitutional:      Appearance: Normal appearance.  HENT:     Head: Normocephalic.     Mouth/Throat:     Mouth: Mucous membranes are moist.  Eyes:     Pupils: Pupils are equal, round, and reactive to light.  Cardiovascular:     Rate and Rhythm: Normal rate.  Pulmonary:     Effort: Pulmonary effort is normal.  Abdominal:     Comments: Gravid, non-tender  Musculoskeletal:        General: Normal range of motion.     Cervical back: Normal range of motion.  Skin:    General: Skin is warm.  Neurological:     General: No focal deficit present.     Mental Status: She is alert and oriented to person, place, and time.  Psychiatric:        Mood and Affect: Mood normal.        Behavior: Behavior normal.     Prenatal labs: ABO, Rh:  + Antibody: Negative (05/27 0000) Rubella: Immune (05/27 0000) RPR: Nonreactive (05/27 0000)  HBsAg: Negative (05/27 0000)  HIV: Non-reactive (05/27 0000)  GBS: Negative/-- (11/26 0000)   Assessment/Plan: 33 yo G1P0 at [redacted]w[redacted]d admitted for IOL  for post-dates.   Labor: plan for cooks catheter and cytotec   Pain control: IV pain medication until active labor, epidural PRN GBS negative Baby girl - Judith Cook 05/11/2024, 7:19 AM

## 2024-05-11 NOTE — Anesthesia Preprocedure Evaluation (Signed)
"                                    Anesthesia Evaluation  Patient identified by MRN, date of birth, ID band Patient awake    Reviewed: Allergy & Precautions, H&P , NPO status , Patient's Chart, lab work & pertinent test results, reviewed documented beta blocker date and time   Airway Mallampati: III  TM Distance: >3 FB Neck ROM: full    Dental no notable dental hx. (+) Teeth Intact, Dental Advisory Given   Pulmonary neg pulmonary ROS   Pulmonary exam normal breath sounds clear to auscultation       Cardiovascular negative cardio ROS Normal cardiovascular exam Rhythm:regular Rate:Normal     Neuro/Psych negative neurological ROS  negative psych ROS   GI/Hepatic negative GI ROS, Neg liver ROS,,,  Endo/Other  negative endocrine ROS    Renal/GU negative Renal ROS  negative genitourinary   Musculoskeletal   Abdominal   Peds  Hematology negative hematology ROS (+)   Anesthesia Other Findings   Reproductive/Obstetrics (+) Pregnancy                              Anesthesia Physical Anesthesia Plan  ASA: 2  Anesthesia Plan: Epidural   Post-op Pain Management: Minimal or no pain anticipated   Induction: Intravenous  PONV Risk Score and Plan: 2 and Treatment may vary due to age or medical condition  Airway Management Planned: Natural Airway and Simple Face Mask  Additional Equipment: Fetal Monitoring  Intra-op Plan:   Post-operative Plan:   Informed Consent: I have reviewed the patients History and Physical, chart, labs and discussed the procedure including the risks, benefits and alternatives for the proposed anesthesia with the patient or authorized representative who has indicated his/her understanding and acceptance.     Dental Advisory Given  Plan Discussed with: Anesthesiologist  Anesthesia Plan Comments: (Labs checked- platelets confirmed with RN in room. Fetal heart tracing, per RN, reported to be stable  enough for sitting procedure. Discussed epidural, and patient consents to the procedure:  included risk of possible headache,backache, failed block, allergic reaction, and nerve injury. This patient was asked if she had any questions or concerns before the procedure started.)        Anesthesia Quick Evaluation  "

## 2024-05-11 NOTE — Progress Notes (Signed)
 Judith Cook is a 33 y.o. G1P0 at [redacted]w[redacted]d admitted for IOL for post-date  Subjective: Comfortable w/ epidural. Denies LOF or VB. + FM  Objective: BP 115/76   Pulse 76   Temp 97.9 F (36.6 C) (Axillary)   Resp 18   Ht 5' 5 (1.651 m)   Wt 83.1 kg   SpO2 100%   BMI 30.50 kg/m  No intake/output data recorded. Total I/O In: -  Out: 500 [Urine:500]  FHT: FHR 125, moderate variability, accelerations +, no deceleration. Toco: q4 min Cat 1    SVE:   Dilation: 5 Effacement (%): 80 Station: -2 Exam by:: Yakub Lodes, MD  Labs: Lab Results  Component Value Date   WBC 6.7 05/11/2024   HGB 10.8 (L) 05/11/2024   HCT 32.3 (L) 05/11/2024   MCV 85.2 05/11/2024   PLT 239 05/11/2024    Assessment / Plan: 33 yo G1P0 at 101w1d admitted for IOL for post-dates   Labor: s/p cytotec  x 2, pitocin  since 18:40 - continue to titrate q30 min by 2 milli-unit, AROM @ 19:40 Recheck in 4hr or sooner PRN Fetal Wellbeing:  Category I Pain Control:  epidural in place     Judith CHRISTELLA Oz, MD 05/11/2024, 7:47 PM

## 2024-05-11 NOTE — Progress Notes (Signed)
 Judith Cook is a 33 y.o. G1P0 at [redacted]w[redacted]d admitted for IOL for post-date  Subjective: Mild pain with contraction. Denies LOF or VB. + FM  Objective: BP 124/81   Pulse 86   Temp 98 F (36.7 C)   Resp 18   Ht 5' 5 (1.651 m)   Wt 83.1 kg   SpO2 97%   BMI 30.50 kg/m  No intake/output data recorded. No intake/output data recorded.  FHT: FHR 125, moderate variability, accelerations +, no deceleration. Toco: irregular Cat 1    SVE:   Dilation: 2 Effacement (%): 50 Station: -3 Exam by:: Elio Haden  Labs: Lab Results  Component Value Date   WBC 6.7 05/11/2024   HGB 10.8 (L) 05/11/2024   HCT 32.3 (L) 05/11/2024   MCV 85.2 05/11/2024   PLT 239 05/11/2024    Assessment / Plan: 33 yo G1P0 at [redacted]w[redacted]d admitted for IOL for post-dates   Labor: s/p cytotec  @ 08:25, progressing well. If 2-3 cm at 4hr mark, plan for second vaginal cytotec . Pitocin  and AROM PRN. Fetal Wellbeing:  Category I Pain Control:  IV pain meds until active labor, epidural PRN    Judith CHRISTELLA Oz, MD 05/11/2024, 11:14 AM

## 2024-05-12 ENCOUNTER — Encounter (HOSPITAL_COMMUNITY): Payer: Self-pay | Admitting: Obstetrics and Gynecology

## 2024-05-12 LAB — CBC
HCT: 26.5 % — ABNORMAL LOW (ref 36.0–46.0)
Hemoglobin: 8.5 g/dL — ABNORMAL LOW (ref 12.0–15.0)
MCH: 28.1 pg (ref 26.0–34.0)
MCHC: 32.1 g/dL (ref 30.0–36.0)
MCV: 87.5 fL (ref 80.0–100.0)
Platelets: 186 K/uL (ref 150–400)
RBC: 3.03 MIL/uL — ABNORMAL LOW (ref 3.87–5.11)
RDW: 13.8 % (ref 11.5–15.5)
WBC: 12.8 K/uL — ABNORMAL HIGH (ref 4.0–10.5)
nRBC: 0 % (ref 0.0–0.2)

## 2024-05-12 MED ORDER — IBUPROFEN 600 MG PO TABS
600.0000 mg | ORAL_TABLET | Freq: Four times a day (QID) | ORAL | Status: DC
  Administered 2024-05-12 – 2024-05-13 (×6): 600 mg via ORAL
  Filled 2024-05-12 (×6): qty 1

## 2024-05-12 MED ORDER — ACETAMINOPHEN 325 MG PO TABS: 650.0000 mg | ORAL_TABLET | Freq: Four times a day (QID) | ORAL | Status: DC | PRN

## 2024-05-12 MED ORDER — ONDANSETRON HCL 4 MG/2ML IJ SOLN: 4.0000 mg | INTRAMUSCULAR | Status: DC | PRN

## 2024-05-12 MED ORDER — BENZOCAINE-MENTHOL 20-0.5 % EX AERO: 1.0000 | INHALATION_SPRAY | CUTANEOUS | Status: DC | PRN

## 2024-05-12 MED ORDER — SIMETHICONE 80 MG PO CHEW: 80.0000 mg | CHEWABLE_TABLET | ORAL | Status: DC | PRN

## 2024-05-12 MED ORDER — OXYCODONE HCL 5 MG PO TABS: 5.0000 mg | ORAL_TABLET | ORAL | Status: DC | PRN

## 2024-05-12 MED ORDER — COCONUT OIL OIL: 1.0000 | TOPICAL_OIL | Status: DC | PRN

## 2024-05-12 MED ORDER — DOCUSATE SODIUM 100 MG PO CAPS
100.0000 mg | ORAL_CAPSULE | Freq: Two times a day (BID) | ORAL | Status: DC
  Administered 2024-05-13: 100 mg via ORAL
  Filled 2024-05-12: qty 1

## 2024-05-12 MED ORDER — DIBUCAINE (PERIANAL) 1 % EX OINT: 1.0000 | TOPICAL_OINTMENT | CUTANEOUS | Status: DC | PRN

## 2024-05-12 MED ORDER — WITCH HAZEL-GLYCERIN EX PADS: 1.0000 | MEDICATED_PAD | CUTANEOUS | Status: DC | PRN

## 2024-05-12 MED ORDER — AMMONIA AROMATIC IN INHA
RESPIRATORY_TRACT | Status: AC
  Filled 2024-05-12: qty 10

## 2024-05-12 MED ORDER — TETANUS-DIPHTH-ACELL PERTUSSIS 5-2-15.5 LF-MCG/0.5 IM SUSP: 0.5000 mL | Freq: Once | INTRAMUSCULAR | Status: DC

## 2024-05-12 MED ORDER — PRENATAL MULTIVITAMIN CH
1.0000 | ORAL_TABLET | Freq: Every day | ORAL | Status: DC
  Administered 2024-05-12 – 2024-05-13 (×2): 1 via ORAL
  Filled 2024-05-12 (×2): qty 1

## 2024-05-12 MED ORDER — DIPHENHYDRAMINE HCL 25 MG PO CAPS: 25.0000 mg | ORAL_CAPSULE | Freq: Four times a day (QID) | ORAL | Status: DC | PRN

## 2024-05-12 MED ORDER — ONDANSETRON HCL 4 MG PO TABS: 4.0000 mg | ORAL_TABLET | ORAL | Status: DC | PRN

## 2024-05-12 MED ORDER — OXYCODONE HCL 5 MG PO TABS: 10.0000 mg | ORAL_TABLET | ORAL | Status: DC | PRN

## 2024-05-12 NOTE — Progress Notes (Signed)
 Patient unable to pass urine after 6 hour, RN performed bladder scan which showed 500 ml of urine in bladder. In and out cath done and drained 900 ml of urine. Patient tolerated procedure well. RN encouraged patient to try using the commode frequently.

## 2024-05-12 NOTE — Lactation Note (Signed)
 This note was copied from a baby's chart. Lactation Consultation Note  Patient Name: Judith Cook Unijb'd Date: 05/12/2024 Age:34 hours, P1  Reason for consult: Initial assessment;Primapara;1st time breastfeeding;Term;Breastfeeding assistance Per mom the baby latched early on for 5 mins, LC noted on the doc flow sheets Latch score 8 . Since has had bottles due to per mom wasn't sure she was giving the baby wasn't getting enough to eat. Per mom the baby is due to feed. LC offered to assist and mom receptive. LC changed a large wet and placed the baby STS on the right breast, football and was on/ off at 1st and then 2nd latched the baby was able to get more depth and fed 20 mins sustained the depth.  LC recommended and provided a hand pump to pre- pump prior to each latch to stretch the nipple / areola complex and prime the milk ducts.  LC recommended since the baby has been introduced to formula , baby have to top her off.  LC reviewed the breast feeding goals for 24 hours - feed with cues and by 3 hours.   Maternal Data -  Has patient been taught Hand Expression?: Yes Does the patient have breastfeeding experience prior to this delivery?: No  Feeding Mother's Current Feeding Choice: Breast Milk and Formula  LATCH Score Latch: Grasps breast easily, tongue down, lips flanged, rhythmical sucking.  Audible Swallowing: Spontaneous and intermittent  Type of Nipple: Everted at rest and after stimulation  Comfort (Breast/Nipple): Soft / non-tender  Hold (Positioning): Assistance needed to correctly position infant at breast and maintain latch.  LATCH Score: 9   Lactation Tools Discussed/Used  Hand pump , #18 F   Interventions Interventions: Breast feeding basics reviewed;Assisted with latch;Skin to skin;Breast massage;Hand express;Pre-pump if needed;Reverse pressure;Breast compression;Adjust position;Support pillows;Position options;Shells;Hand pump;Education;LC Services  brochure;CDC milk storage guidelines;CDC Guidelines for Breast Pump Cleaning  Discharge Pump: Manual;Hands Free;Personal  Consult Status Consult Status: Follow-up Date: 05/13/24 Follow-up type: In-patient    Rollene Caldron Tannah Dreyfuss 05/12/2024, 6:02 PM

## 2024-05-12 NOTE — Anesthesia Postprocedure Evaluation (Signed)
"   Anesthesia Post Note  Patient: Judith Cook  Procedure(s) Performed: AN AD HOC LABOR EPIDURAL     Patient location during evaluation: Mother Baby Anesthesia Type: Epidural Level of consciousness: awake and alert Pain management: pain level controlled Vital Signs Assessment: post-procedure vital signs reviewed and stable Respiratory status: spontaneous breathing, nonlabored ventilation and respiratory function stable Cardiovascular status: stable Postop Assessment: no headache, no backache and epidural receding Anesthetic complications: no   No notable events documented.  Last Vitals:  Vitals:   05/12/24 0337 05/12/24 0800  BP: 119/73 114/75  Pulse: 98 (!) 107  Resp: 18 18  Temp: 36.9 C 36.7 C  SpO2: 98% 99%    Last Pain:  Vitals:   05/12/24 0800  TempSrc: Oral  PainSc: 0-No pain   Pain Goal:                   Deuntae Kocsis      "

## 2024-05-12 NOTE — Progress Notes (Signed)
 Post Partum Day 1 Subjective: Pain controlled. No void, cath done @ 0730. Just attempted to avoid again, no urge. Ambulating. No dizziness  Objective: Blood pressure 114/75, pulse (!) 107, temperature 98.1 F (36.7 C), temperature source Oral, resp. rate 18, height 5' 5 (1.651 m), weight 83.1 kg, SpO2 99%, unknown if currently breastfeeding.  Physical Exam:  General: alert, cooperative, and no distress Lochia: appropriate Perineum: healing well so far Uterine Fundus: firm DVT Evaluation: 1+ edema bilaterally, no calf tenderness  Recent Labs    05/11/24 0738 05/12/24 0455  HGB 10.8* 8.5*  HCT 32.3* 26.5*    Assessment/Plan: 33 yo G1 now P1 PPD#1 (delivery 20 minutes prior to MN) following IOL late term  - PP: awaiting void. - ABLA, not clinically significant for this hospitalization: Hb 10.8-->8.5 - Dispo: anticipate DC home tomorrow   LOS: 1 day   Judith DELENA Sharps, DO 05/12/2024, 10:26 AM

## 2024-05-13 MED ORDER — IBUPROFEN 600 MG PO TABS: 600.0000 mg | ORAL_TABLET | Freq: Four times a day (QID) | ORAL | Status: AC | PRN

## 2024-05-13 MED ORDER — ACETAMINOPHEN 325 MG PO TABS: 650.0000 mg | ORAL_TABLET | Freq: Four times a day (QID) | ORAL | Status: AC | PRN

## 2024-05-13 NOTE — Progress Notes (Signed)
 Post Partum Day 2 Subjective: Pain is well controlled. Ambulating, voiding, and tolerating PO well. Passing gas and has had a bowel movement. Denies lightheadedness. Lochia appropriate. Bottlefeeding. Desires discharge home.   Objective:    05/13/2024    1:23 PM 05/13/2024    4:55 AM 05/12/2024    7:31 PM  Vitals with BMI  Systolic 126 120 880  Diastolic 83 79 72  Pulse 108 100 101     Physical Exam:  General: alert, cooperative, and no distress Lochia: appropriate Uterine Fundus: firm DVT Evaluation: no e/o DVT  Recent Labs    05/11/24 0738 05/12/24 0455  HGB 10.8* 8.5*  HCT 32.3* 26.5*    Assessment/Plan: 33 yo G1 now P1 PPD#2 (delivery 20 minutes prior to MN) following IOL late term  - PP: Doing well. Continue routine postpartum care. - ABLA, not clinically significant for this hospitalization: Hb 10.8-->8.5. Asx. Likely cause for mild tachycardia. Plan PO iron outpatient.  - Dispo: anticipate DC home today.   LOS: 2 days   Rubie DELENA Husky, MD 05/13/2024, 3:25 PM

## 2024-05-13 NOTE — Discharge Summary (Signed)
 "    Postpartum Discharge Summary  Date of Service updated 12/29-12/31/2025     Patient Name: Judith Cook DOB: 05-04-1991 MRN: [id number]  Date of admission: 05/11/2024 Delivery date:05/11/2024 Delivering provider: LANE CHARMAINE HERO Date of discharge: 05/13/2024  Admitting diagnosis: Post term pregnancy at [redacted] weeks gestation [O48.0, Z3A.41] Intrauterine pregnancy: [redacted]w[redacted]d     Secondary diagnosis:  Principal Problem:   Post term pregnancy at [redacted] weeks gestation  Additional problems: ABLA    Discharge diagnosis: Term Pregnancy Delivered and Anemia                                              Post partum procedures:none Augmentation: AROM, Pitocin , and Cytotec  Complications: None  Hospital course: Induction of Labor With Vaginal Delivery   33 y.o. yo G1P1001 at [redacted]w[redacted]d was admitted to the hospital 05/11/2024 for induction of labor.  Indication for induction: Postdates.  Patient had an labor course complicated by nothing Membrane Rupture Time/Date: 7:39 PM,05/11/2024  Delivery Method:Vaginal, Spontaneous Operative Delivery:N/A Episiotomy: None Lacerations:  2nd degree;Perineal Details of delivery can be found in separate delivery note.  Patient had a postpartum course complicated by ABLA. Patient is discharged home 05/13/2024.  Newborn Data: Birth date:05/11/2024 Birth time:11:38 PM Gender:Female Living status:Living Apgars:8 ,9  Weight:2980 g  Magnesium Sulfate received: No BMZ received: No Rhophylac:No MMR:No T-DaP:Given prenatally Flu: No RSV Vaccine received: No Transfusion:No Immunizations administered: There is no immunization history for the selected administration types on file for this patient.  Physical exam  Vitals:   05/12/24 1556 05/12/24 1931 05/13/24 0455 05/13/24 1323  BP: 116/75 119/72 120/79 126/83  Pulse: 97 (!) 101 100 (!) 108  Resp: 16 17 16 16   Temp: 98.2 F (36.8 C) 98.5 F (36.9 C) 98.5 F (36.9 C) 98 F (36.7 C)  TempSrc:  Oral Oral Oral Oral  SpO2: 100% 99% 98%   Weight:      Height:       General: alert, cooperative, and no distress Lochia: appropriate Uterine Fundus: firm DVT Evaluation: No evidence of DVT seen on physical exam. Labs: Lab Results  Component Value Date   WBC 12.8 (H) 05/12/2024   HGB 8.5 (L) 05/12/2024   HCT 26.5 (L) 05/12/2024   MCV 87.5 05/12/2024   PLT 186 05/12/2024      Latest Ref Rng & Units 11/27/2017   12:31 PM  CMP  Glucose 70 - 99 mg/dL 87   BUN 6 - 20 mg/dL 9   Creatinine 9.55 - 8.99 mg/dL 9.36   Sodium 864 - 854 mmol/L 141   Potassium 3.5 - 5.1 mmol/L 3.6   Chloride 98 - 111 mmol/L 105   CO2 22 - 32 mmol/L 29   Calcium 8.9 - 10.3 mg/dL 8.9    Edinburgh Score:    05/12/2024    7:32 PM  Edinburgh Postnatal Depression Scale Screening Tool  I have been able to laugh and see the funny side of things. 0  I have looked forward with enjoyment to things. 0  I have blamed myself unnecessarily when things went wrong. 1  I have been anxious or worried for no good reason. 0  I have felt scared or panicky for no good reason. 1  Things have been getting on top of me. 0  I have been so unhappy that I have had difficulty sleeping. 0  I  have felt sad or miserable. 0  I have been so unhappy that I have been crying. 0  The thought of harming myself has occurred to me. 0  Edinburgh Postnatal Depression Scale Total 2      After visit meds:  Allergies as of 05/13/2024   No Known Allergies      Medication List     STOP taking these medications    ACETAMINOPHEN -BUTALBITAL 50-325 MG Tabs   BIOTIN PO   cyclobenzaprine 5 MG tablet Commonly known as: FLEXERIL   nitrofurantoin (macrocrystal-monohydrate) 100 MG capsule Commonly known as: MACROBID       TAKE these medications    acetaminophen  325 MG tablet Commonly known as: Tylenol  Take 2 tablets (650 mg total) by mouth every 6 (six) hours as needed (for pain scale < 4).   ibuprofen  600 MG tablet Commonly  known as: ADVIL  Take 1 tablet (600 mg total) by mouth every 6 (six) hours as needed.         Discharge home in stable condition Infant Feeding: Breast Infant Disposition:home with mother Discharge instruction: per After Visit Summary and Postpartum booklet. Activity: Advance as tolerated. Pelvic rest for 6 weeks.  Diet: routine diet Anticipated Birth Control: Unsure Postpartum Appointment:4 weeks Additional Postpartum F/U: routine Future Appointments:No future appointments. Follow up Visit:  Follow-up Information     Ob/Gyn, [facility] Follow up in 4 week(s).   Contact information: [address] [place] [address] [phone]                     05/13/2024 Rubie DELENA Husky, MD   "

## 2024-05-13 NOTE — Progress Notes (Signed)
 CSW received a consult due to MOB not having custody over her kids. Per chart review, MOB is a G1P1. Per support staff, MOB is a first time mom. At this time there will be no CSW involvement needed. Please contact the CSW if additional needs are required.  CSW identifies no further need for intervention and no barriers to discharge at this time.  Rosina Molt, [place] Clinical Social Worker [phone]

## 2024-05-20 ENCOUNTER — Telehealth (HOSPITAL_COMMUNITY): Payer: Self-pay | Admitting: *Deleted

## 2024-05-20 NOTE — Telephone Encounter (Signed)
 05/20/2024  Name: TESSA SEABERRY MRN: [id number] DOB: [birth year]  Reason for Call:  Transition of Care Hospital Discharge Call  Contact Status: Patient Contact Status: Complete  Language assistant needed: Interpreter Mode: Interpreter Not Needed        Follow-Up Questions: Do You Have Any Concerns About Your Health As You Heal From Delivery?: No Do You Have Any Concerns About Your Infants Health?: No  Edinburgh Postnatal Depression Scale:  In the Past 7 Days: I have been able to laugh and see the funny side of things.: As much as I always could I have looked forward with enjoyment to things.: As much as I ever did I have blamed myself unnecessarily when things went wrong.: No, never I have been anxious or worried for no good reason.: Hardly ever I have felt scared or panicky for no good reason.: No, not at all Things have been getting on top of me.: No, I have been coping as well as ever I have been so unhappy that I have had difficulty sleeping.: Not at all I have felt sad or miserable.: No, not at all I have been so unhappy that I have been crying.: No, never The thought of harming myself has occurred to me.: Never [place] Postnatal Depression Scale Total: 1  PHQ2-9 Depression Scale:     Discharge Follow-up: Edinburgh score requires follow up?: No Patient was advised of the following resources:: Support Group, Breastfeeding Support Group (declines postpartum group information via email)  Post-discharge interventions: Reviewed Newborn Safe Sleep Practices  Mliss Sieve, RN 05/20/2024 14:24
